# Patient Record
Sex: Male | Born: 1955 | Race: White | Hispanic: No | Marital: Married | Smoking: Former smoker
Health system: Southern US, Community
[De-identification: ages and names within clinical notes are randomized; demographics above are authoritative.]

## PROBLEM LIST (undated history)

## (undated) DIAGNOSIS — H548 Legal blindness, as defined in USA: Secondary | ICD-10-CM

## (undated) DIAGNOSIS — K219 Gastro-esophageal reflux disease without esophagitis: Secondary | ICD-10-CM

## (undated) HISTORY — PX: DENTAL SURGERY: SHX609

## (undated) HISTORY — DX: Legal blindness, as defined in USA: H54.8

## (undated) HISTORY — DX: Gastro-esophageal reflux disease without esophagitis: K21.9

## (undated) HISTORY — PX: HERNIA REPAIR: SHX51

---

## 2013-12-25 ENCOUNTER — Ambulatory Visit: Payer: Self-pay | Admitting: Physician Assistant

## 2015-11-21 ENCOUNTER — Other Ambulatory Visit: Payer: Self-pay | Admitting: Orthopedic Surgery

## 2015-11-21 DIAGNOSIS — M25561 Pain in right knee: Secondary | ICD-10-CM

## 2015-11-21 DIAGNOSIS — G8929 Other chronic pain: Secondary | ICD-10-CM

## 2015-11-21 DIAGNOSIS — M2391 Unspecified internal derangement of right knee: Secondary | ICD-10-CM

## 2015-12-03 ENCOUNTER — Ambulatory Visit
Admission: RE | Admit: 2015-12-03 | Discharge: 2015-12-03 | Disposition: A | Discharge status: Home / Self Care | Payer: Managed Care, Other (non HMO) | Source: Ambulatory Visit | Attending: Orthopedic Surgery | Admitting: Orthopedic Surgery

## 2015-12-03 DIAGNOSIS — M2391 Unspecified internal derangement of right knee: Secondary | ICD-10-CM

## 2015-12-03 DIAGNOSIS — G8929 Other chronic pain: Secondary | ICD-10-CM | POA: Insufficient documentation

## 2015-12-03 DIAGNOSIS — M948X6 Other specified disorders of cartilage, lower leg: Secondary | ICD-10-CM | POA: Insufficient documentation

## 2015-12-03 DIAGNOSIS — M25561 Pain in right knee: Secondary | ICD-10-CM | POA: Insufficient documentation

## 2015-12-03 DIAGNOSIS — M2241 Chondromalacia patellae, right knee: Secondary | ICD-10-CM | POA: Diagnosis not present

## 2017-02-05 ENCOUNTER — Ambulatory Visit (INDEPENDENT_AMBULATORY_CARE_PROVIDER_SITE_OTHER): Payer: Managed Care, Other (non HMO) | Admitting: Urology

## 2017-02-05 ENCOUNTER — Encounter: Payer: Self-pay | Admitting: Urology

## 2017-02-05 VITALS — BP 111/76 | HR 98 | Ht 68.0 in | Wt 225.0 lb

## 2017-02-05 DIAGNOSIS — N200 Calculus of kidney: Secondary | ICD-10-CM

## 2017-02-05 LAB — URINALYSIS, COMPLETE
Bilirubin, UA: NEGATIVE
Glucose, UA: NEGATIVE
LEUKOCYTES UA: NEGATIVE
NITRITE UA: NEGATIVE
Protein, UA: NEGATIVE
Specific Gravity, UA: 1.025 (ref 1.005–1.030)
Urobilinogen, Ur: 0.2 mg/dL (ref 0.2–1.0)
pH, UA: 5.5 (ref 5.0–7.5)

## 2017-02-05 NOTE — Progress Notes (Signed)
Erroneous Encounter: Patient left without being seen.

## 2017-04-12 IMAGING — MR MR KNEE*R* W/O CM
5 series · 35 of 40 positions shown · non-contrast
Comparison: None.

CLINICAL DATA: Right knee pain when sitting down for 2-3 weeks.
Pain above the patella.

EXAM:
MRI OF THE RIGHT KNEE WITHOUT CONTRAST
TECHNIQUE: Multiplanar, multisequence MR imaging of the knee was performed. No
intravenous contrast was administered.

[Series 3: PD fat-sat · axial · 3.0mm · 0.50mm/px · z∈[-63,+50]mm · 8 of 35 slices shown (1 of 3)]
[im 1/35]
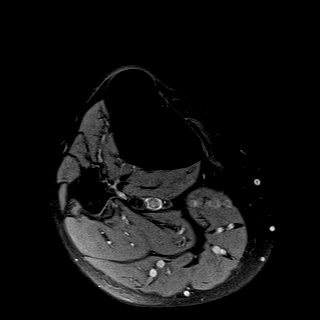
[im 5/35]
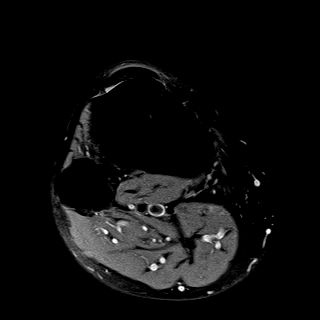
[im 10/35]
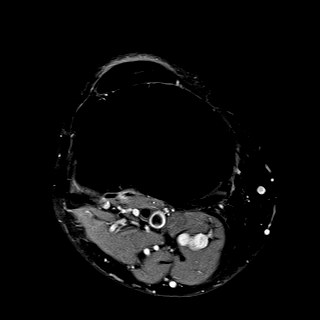
[im 15/35]
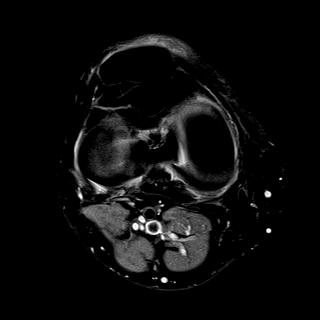
[im 20/35]
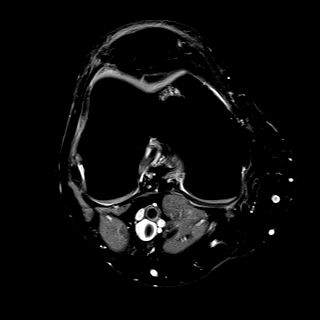
[im 25/35]
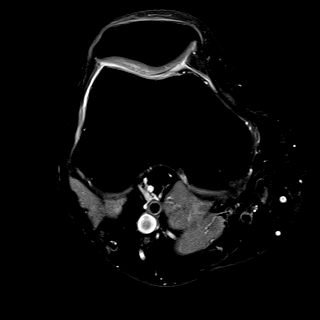
[im 30/35]
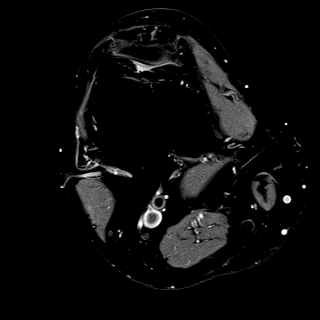
[im 35/35]
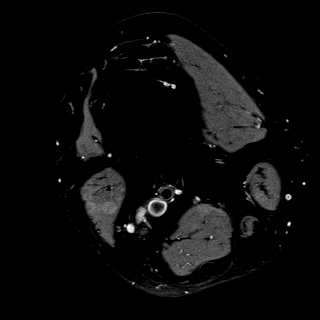

[Series 4: T1 · coronal · 3.0mm · 0.50mm/px · 3 of 29 slices shown]
[im 1/29]
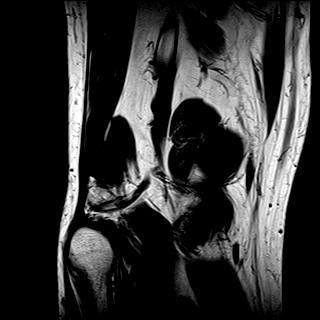
[im 5/29]
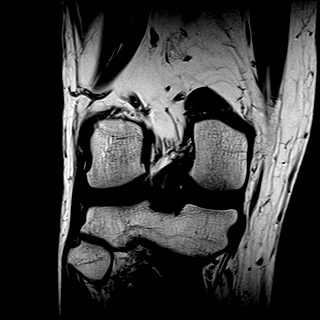
[im 9/29]
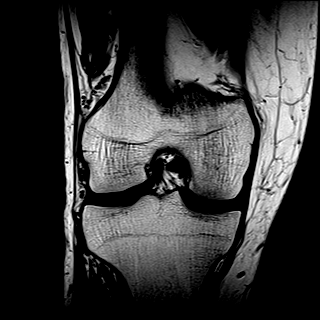

[Series 5: T2 fat-sat · coronal · 3.0mm · 0.31mm/px · 8 of 29 slices shown]
[im 1/29]
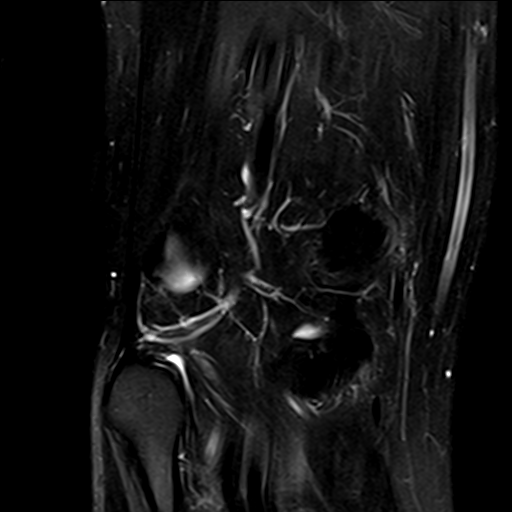
[im 5/29]
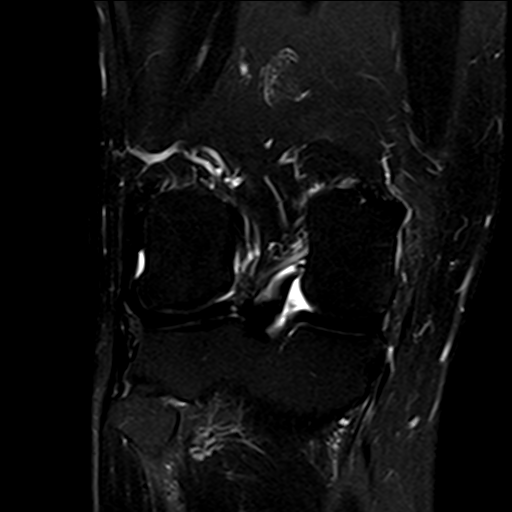
[im 9/29]
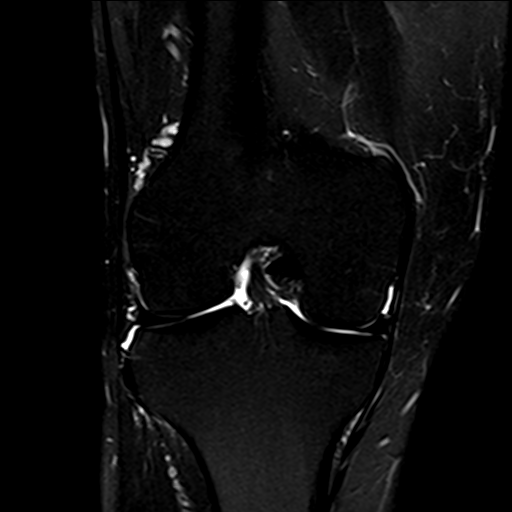
[im 13/29]
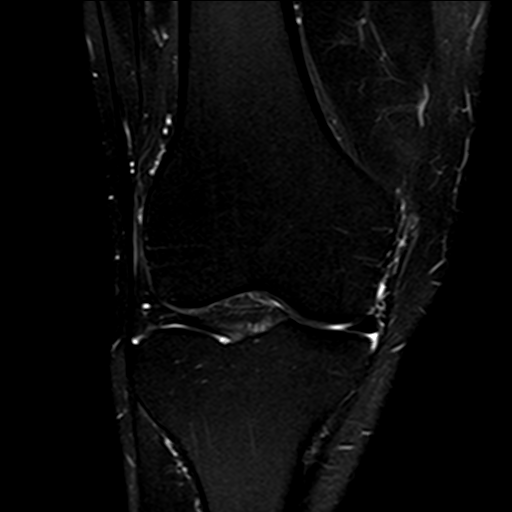
[im 17/29]
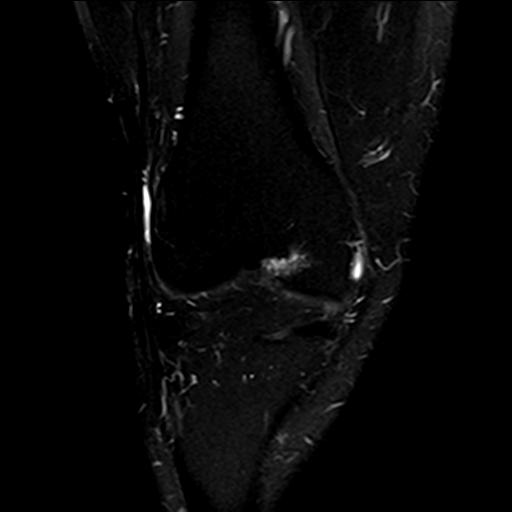
[im 21/29]
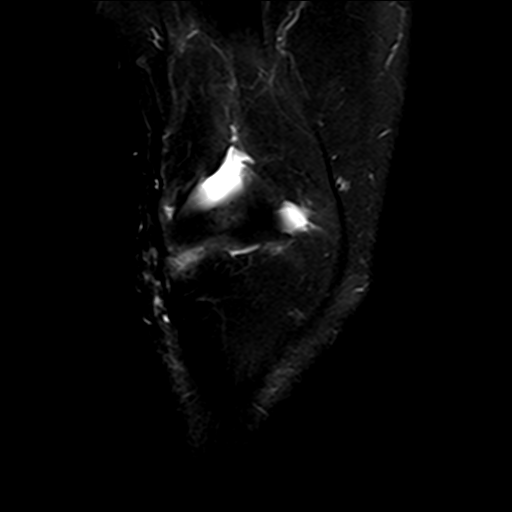
[im 25/29]
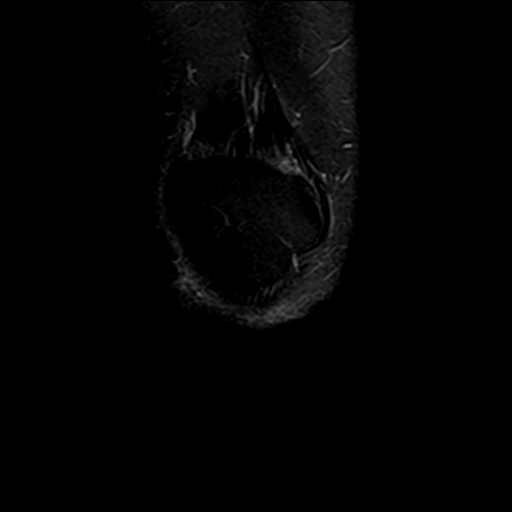
[im 29/29]
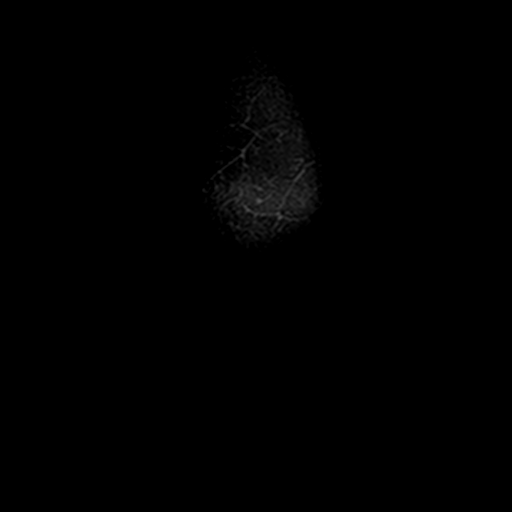

[Series 6: PD fat-sat · sagittal · 3.0mm · 0.50mm/px · 8 of 30 slices shown (2 of 3)]
[im 1/30]
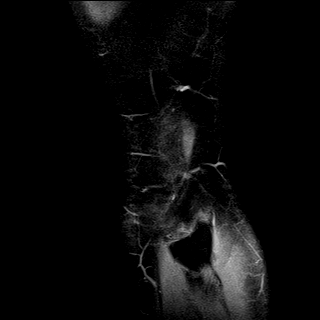
[im 5/30]
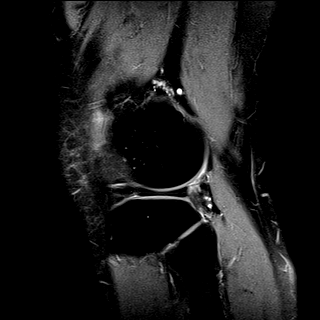
[im 9/30]
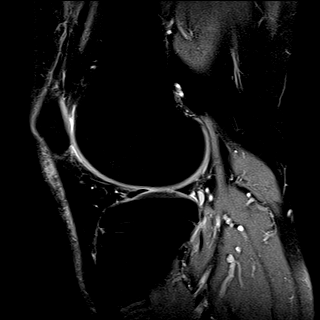
[im 13/30]
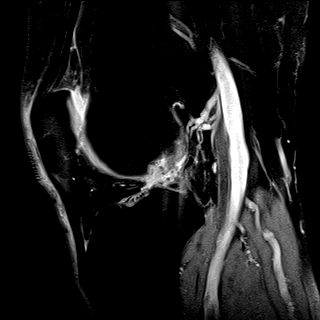
[im 17/30]
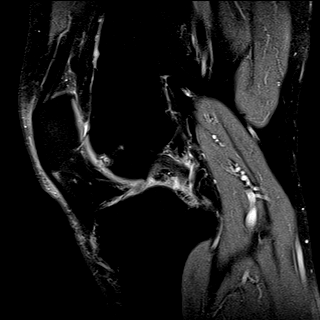
[im 21/30]
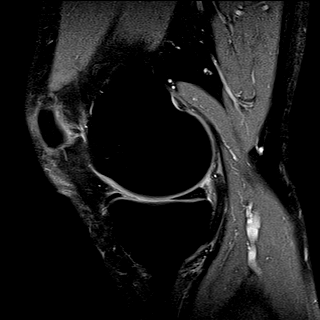
[im 25/30]
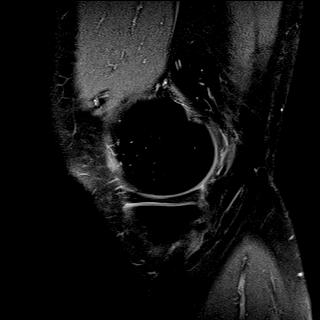
[im 30/30]
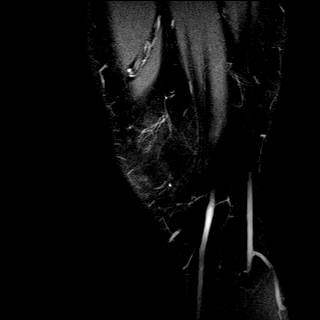

[Series 7: PD fat-sat · coronal · 3.0mm · 0.50mm/px · 8 of 29 slices shown (3 of 3)]
[im 1/29]
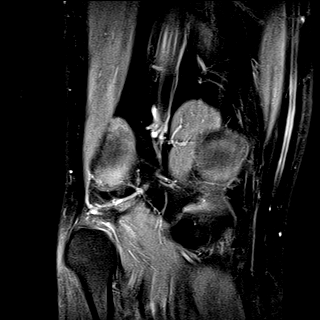
[im 5/29]
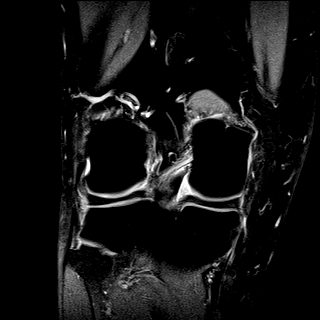
[im 9/29]
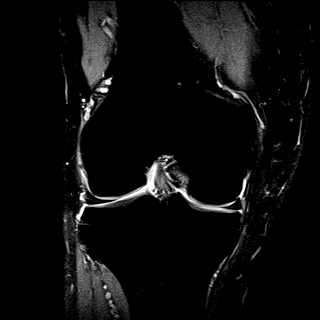
[im 13/29]
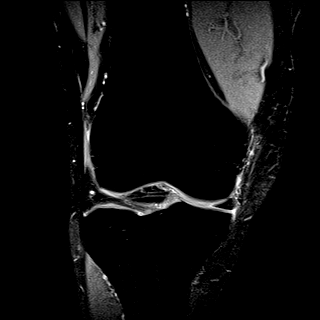
[im 17/29]
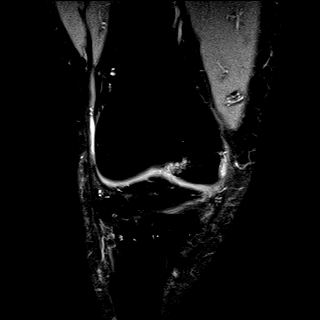
[im 21/29]
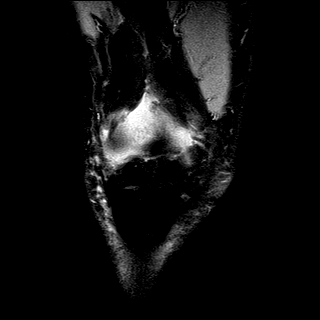
[im 25/29]
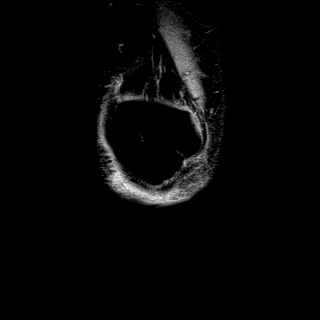
[im 29/29]
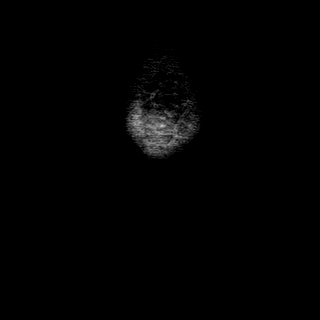

[35 of 40 positions shown; findings below may reference images not displayed]

FINDINGS: MENISCI

Medial meniscus:  Intact.

Lateral meniscus:  Intact.

LIGAMENTS

Cruciates:  Intact ACL and PCL.

Collaterals: Medial collateral ligament is intact. Lateral
collateral ligament complex is intact.

CARTILAGE

Patellofemoral: Chondromalacia and mild fissuring of the lateral
patellar facet. Partial-thickness cartilage loss of the medial
trochlea with subchondral reactive marrow changes.

Medial:  No chondral defect.

Lateral:  No chondral defect.

Joint: No joint effusion. Normal Hoffa's fat. No plical thickening.

Popliteal Fossa:  No Baker cyst.  Intact popliteus tendon.

Extensor Mechanism: Intact quadriceps tendon. Mild increased signal
in the proximal patellar tendon likely reflecting artifact versus
less likely mild tendinosis.

Bones: No other marrow signal abnormality. No fracture or
dislocation.

Other: None
IMPRESSION: 1. Chondromalacia and mild fissuring of the lateral patellar facet.
Partial-thickness cartilage loss of the medial trochlea with
subchondral reactive marrow changes.

## 2022-02-17 ENCOUNTER — Ambulatory Visit (LOCAL_COMMUNITY_HEALTH_CENTER): Payer: Medicare Other

## 2022-02-17 DIAGNOSIS — Z719 Counseling, unspecified: Secondary | ICD-10-CM

## 2022-02-17 DIAGNOSIS — Z23 Encounter for immunization: Secondary | ICD-10-CM | POA: Diagnosis not present

## 2022-02-17 NOTE — Progress Notes (Signed)
  Are you feeling sick today? No   Have you ever received a dose of COVID-19 Vaccine? [employer], [place], [place], [place], Other) Yes  If yes, which vaccine and how many doses?   PFIZER , 5   Did you bring the vaccination record card or other documentation?  Yes   Do you have a health condition or are undergoing treatment that makes you moderately or severely immunocompromised? This would include, but not be limited to: cancer, HIV, organ transplant, immunosuppressive therapy/high-dose corticosteroids, or moderate/severe primary immunodeficiency.  No  Have you received COVID-19 vaccine before or during hematopoietic cell transplant (HCT) or CAR-T-cell therapies? No  Have you ever had an allergic reaction to: (This would include a severe allergic reaction or a reaction that caused hives, swelling, or respiratory distress, including wheezing.) A component of a COVID-19 vaccine or a previous dose of COVID-19 vaccine? No   Have you ever had an allergic reaction to another vaccine (other thanCOVID-19 vaccine) or an injectable medication? (This would include a severe allergic reaction or a reaction that caused hives, swelling, or respiratory distress, including wheezing.)   No    Do you have a history of any of the following:  Myocarditis or Pericarditis No  Dermal fillers:  No  Multisystem Inflammatory Syndrome (MIS-C or MIS-A)? No  COVID-19 disease within the past 3 months? No  Vaccinated with monkeypox vaccine in the last 4 weeks? No  Eligible, administered Pfizer Comirnaty [phone], monitored, tolerated well. Provided VIS and NCIR copy. M.Armonii Sieh, LPN.

## 2023-01-04 ENCOUNTER — Encounter: Payer: Self-pay | Admitting: Emergency Medicine

## 2023-01-04 ENCOUNTER — Ambulatory Visit
Admission: EM | Admit: 2023-01-04 | Discharge: 2023-01-04 | Disposition: A | Discharge status: Home / Self Care | Payer: Medicare Other | Attending: Emergency Medicine | Admitting: Emergency Medicine

## 2023-01-04 DIAGNOSIS — Z20822 Contact with and (suspected) exposure to covid-19: Secondary | ICD-10-CM

## 2023-01-04 DIAGNOSIS — J069 Acute upper respiratory infection, unspecified: Secondary | ICD-10-CM

## 2023-01-04 LAB — RESP PANEL BY RT-PCR (RSV, FLU A&B, COVID)  RVPGX2
Influenza A by PCR: NEGATIVE
Influenza B by PCR: NEGATIVE
Resp Syncytial Virus by PCR: NEGATIVE
SARS Coronavirus 2 by RT PCR: NEGATIVE

## 2023-01-04 MED ORDER — AEROCHAMBER MV MISC: 1 refills | Status: AC

## 2023-01-04 MED ORDER — PROMETHAZINE-DM 6.25-15 MG/5ML PO SYRP: 5.0000 mL | ORAL_SOLUTION | Freq: Four times a day (QID) | ORAL | 0 refills | Status: AC | PRN

## 2023-01-04 MED ORDER — ALBUTEROL SULFATE HFA 108 (90 BASE) MCG/ACT IN AERS: 1.0000 | INHALATION_SPRAY | RESPIRATORY_TRACT | 0 refills | Status: AC | PRN

## 2023-01-04 NOTE — ED Triage Notes (Signed)
Pt presents with a cough, sore throat, sinus pressure and bodyaches x 4 days. Pt states he was exposed to Covid 1 week ago.

## 2023-01-04 NOTE — Discharge Instructions (Addendum)
start Flonase, saline nasal irrigation, discontinue Alavert and start Mucinex or Mucinex D.  Promethazine DM as needed for cough, 2 puffs from an albuterol inhaler with a spacer every 4-6 hours as needed  Here is a list of primary care providers who are taking new patients:  [facility] [place] Dr. Joseph Berkshire (sports medicine) Dr. Elizabeth Sauer [address] [place] [address] [phone]  [facility] [address] [place], [place] [address] [phone]  [facility] [place] [address]  [place] [address]  [phone]  [facility] [address] [place], [place] [address] [phone]  [facility] [address]  [phone] [place], [place] [address]  Go to www.goodrx.com  or www.costplusdrugs.com to look up your medications. This will give you a list of where you can find your prescriptions at the most affordable prices. Or ask the pharmacist what the cash price is, or if they have any other discount programs available to help make your medication more affordable. This can be less expensive than what you would pay with insurance.

## 2023-01-04 NOTE — ED Provider Notes (Signed)
HPI  SUBJECTIVE:  Adam Lester is a 67 y.o. male who presents with 5 days of cough productive of clear loose phlegm, body aches, watery eyes, clear rhinorrhea, headache, nasal congestion, sore throat from postnasal drip, sinus pressure, wheezing with exertion.  He reports shortness of breath after exertion states that he has felt feverish, but has been taking aspirin regularly.  No facial swelling, upper dental pain.  No chest pain, nausea, vomiting, diarrhea, abdominal pain.  He was exposed to COVID from 2 family members 1 week ago.  His wife is also sick with similar respiratory symptoms.  He got 6-7 COVID vaccines and also this years flu vaccine.  He is unable to sleep at night because of the cough.  He took aspirin within 6 hours of evaluation, has been taking Alavert, salt water gargles and cough drops without improvement in his symptoms.  Symptoms are worse with exertion.  He quit smoking in 2004, is legally blind and has a history of allergies.  He states this does not feel like his allergies.  No history of pulmonary disease. PCP: [facility].  States that he is looking for a new PCP.  Past Medical History:  Diagnosis Date   Acid reflux    Legally blind     Past Surgical History:  Procedure Laterality Date   DENTAL SURGERY     HERNIA REPAIR      Family History  Problem Relation Age of Onset   Lung cancer Mother     Social History   Tobacco Use   Smoking status: Former    Types: Cigarettes   Smokeless tobacco: Never  Vaping Use   Vaping status: Never Used  Substance Use Topics   Alcohol use: Yes    No current facility-administered medications for this encounter.  Current Outpatient Medications:    albuterol (VENTOLIN HFA) 108 (90 Base) MCG/ACT inhaler, Inhale 1-2 puffs into the lungs every 4 (four) hours as needed for wheezing or shortness of breath., Disp: 1 each, Rfl: 0   promethazine-dextromethorphan (PROMETHAZINE-DM) 6.25-15 MG/5ML syrup, Take 5 mLs by  mouth 4 (four) times daily as needed for cough., Disp: 118 mL, Rfl: 0   Spacer/Aero-Holding Chambers (AEROCHAMBER MV) inhaler, Use as instructed, Disp: 1 each, Rfl: 1  No Known Allergies   ROS  As noted in HPI.   Physical Exam  BP (!) 148/91 (BP Location: Right Arm)   Pulse 100   Temp 98.8 F (37.1 C) (Oral)   Resp 18   SpO2 96%   Constitutional: Well developed, well nourished, no acute distress Eyes:  EOMI, conjunctiva normal bilaterally HENT: Normocephalic, atraumatic,mucus membranes moist.  Positive clear nasal congestion.  Normal turbinates.  No maxillary, frontal sinus tenderness.  Normal oropharynx, tonsils without exudates.  No obvious postnasal drip. Neck: No cervical lymphadenopathy Respiratory: Normal inspiratory effort, lungs clear bilaterally, good air movement.  No anterior, lateral chest wall tenderness Cardiovascular: Borderline regular tachycardia, no murmurs rubs or gallops GI: nondistended skin: No rash, skin intact Musculoskeletal: no deformities Neurologic: Alert & oriented x 3, no focal neuro deficits Psychiatric: Speech and behavior appropriate   ED Course   Medications - No data to display  Orders Placed This Encounter  Procedures   Resp panel by RT-PCR (RSV, Flu A&B, Covid) Anterior Nasal Swab    Standing Status:   Standing    Number of Occurrences:   1    Results for orders placed or performed during the hospital encounter of 01/04/23 (from the past  24 hour(s))  Resp panel by RT-PCR (RSV, Flu A&B, Covid) Anterior Nasal Swab     Status: None   Collection Time: 01/04/23 11:44 AM   Specimen: Anterior Nasal Swab  Result Value Ref Range   SARS Coronavirus 2 by RT PCR NEGATIVE NEGATIVE   Influenza A by PCR NEGATIVE NEGATIVE   Influenza B by PCR NEGATIVE NEGATIVE   Resp Syncytial Virus by PCR NEGATIVE NEGATIVE   No results found.  ED Clinical Impression  1. Upper respiratory tract infection, unspecified type   2. Encounter for laboratory  testing for COVID-19 virus      ED Assessment/Plan     COVID, flu, RSV negative.  Discussed this with patient while in department.  Patient presents with a upper respiratory infection.  Will have him start Flonase, saline nasal irrigation, discontinue Alavert and start Mucinex or Mucinex D.  Promethazine DM as needed for cough, 2 puffs from an albuterol inhaler with a spacer every 4-6 hours as needed.  Will provide primary care list for routine care.  Discussed labs,  MDM, treatment plan, and plan for follow-up with patient.  patient agrees with plan.   Meds ordered this encounter  Medications   albuterol (VENTOLIN HFA) 108 (90 Base) MCG/ACT inhaler    Sig: Inhale 1-2 puffs into the lungs every 4 (four) hours as needed for wheezing or shortness of breath.    Dispense:  1 each    Refill:  0   promethazine-dextromethorphan (PROMETHAZINE-DM) 6.25-15 MG/5ML syrup    Sig: Take 5 mLs by mouth 4 (four) times daily as needed for cough.    Dispense:  118 mL    Refill:  0   Spacer/Aero-Holding Chambers (AEROCHAMBER MV) inhaler    Sig: Use as instructed    Dispense:  1 each    Refill:  1      *This clinic note was created using Dragon dictation software. Therefore, there may be occasional mistakes despite careful proofreading.  ?    Domenick Gong, MD 01/06/23 [phone]
